# Patient Record
Sex: Male | Born: 1995 | Race: White | Hispanic: No | Marital: Single | Smoking: Never smoker
Health system: Southern US, Community
[De-identification: ages and names within clinical notes are randomized; demographics above are authoritative.]

## PROBLEM LIST (undated history)

## (undated) HISTORY — PX: APPENDECTOMY: SHX54

---
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
## 2014-01-18 ENCOUNTER — Encounter: Payer: Self-pay | Admitting: Emergency Medicine

## 2014-01-18 ENCOUNTER — Ambulatory Visit (INDEPENDENT_AMBULATORY_CARE_PROVIDER_SITE_OTHER): Payer: BC Managed Care – PPO | Admitting: Emergency Medicine

## 2014-01-18 VITALS — BP 110/72 | HR 69 | Temp 99.0°F | Resp 16 | Ht 72.0 in | Wt 172.4 lb

## 2014-01-18 DIAGNOSIS — Z0000 Encounter for general adult medical examination without abnormal findings: Secondary | ICD-10-CM

## 2014-01-18 DIAGNOSIS — Z23 Encounter for immunization: Secondary | ICD-10-CM

## 2014-01-18 LAB — CBC WITH DIFFERENTIAL/PLATELET
Basophils Absolute: 0 10*3/uL (ref 0.0–0.1)
Basophils Relative: 1 % (ref 0–1)
EOS PCT: 2 % (ref 0–5)
Eosinophils Absolute: 0.1 10*3/uL (ref 0.0–0.7)
HEMATOCRIT: 46.7 % (ref 39.0–52.0)
HEMOGLOBIN: 16.5 g/dL (ref 13.0–17.0)
LYMPHS ABS: 1.2 10*3/uL (ref 0.7–4.0)
LYMPHS PCT: 31 % (ref 12–46)
MCH: 31.7 pg (ref 26.0–34.0)
MCHC: 35.3 g/dL (ref 30.0–36.0)
MCV: 89.6 fL (ref 78.0–100.0)
MONO ABS: 0.5 10*3/uL (ref 0.1–1.0)
MONOS PCT: 13 % — AB (ref 3–12)
Neutro Abs: 2.1 10*3/uL (ref 1.7–7.7)
Neutrophils Relative %: 53 % (ref 43–77)
PLATELETS: 212 10*3/uL (ref 150–400)
RBC: 5.21 MIL/uL (ref 4.22–5.81)
RDW: 12 % (ref 11.5–15.5)
WBC: 3.9 10*3/uL — AB (ref 4.0–10.5)

## 2014-01-18 LAB — LIPID PANEL
CHOL/HDL RATIO: 4.5 ratio
Cholesterol: 174 mg/dL (ref 0–200)
HDL: 39 mg/dL — AB (ref >39)
LDL Cholesterol: 118 mg/dL — ABNORMAL HIGH (ref 0–99)
Triglycerides: 83 mg/dL (ref <150)
VLDL: 17 mg/dL (ref 0–40)

## 2014-01-18 LAB — GLUCOSE, POCT (MANUAL RESULT ENTRY): POC GLUCOSE: 85 mg/dL (ref 70–99)

## 2014-01-18 NOTE — Patient Instructions (Signed)

## 2014-01-18 NOTE — Progress Notes (Signed)
   Subjective:    Patient ID: Jose Woodard, male    DOB: 02/03/1996, 18 y.o.   MRN: [id number] This chart was scribed for Collene GobbleSteven A Veronica Fretz, MD by Julian HyMorgan Graham, ED Scribe. The patient was seen in Room 24. The patient's care was started at 11:45 AM.   HPI HPI Comments: Jose Woodard is a 18 y.o. male who presents to the [facility] for a complete physical exam. He is doing well, with no complaints.  Review of Systems  Constitutional: Negative for fever and chills.  HENT: Negative for drooling.   Gastrointestinal: Negative for nausea and vomiting.  Psychiatric/Behavioral: Negative for confusion.  All other systems reviewed and are negative.   No past medical history on file. Past Surgical History  Procedure Laterality Date  . Appendectomy      02/2013   No Known Allergies No current outpatient prescriptions on file.   No current facility-administered medications for this visit.     Objective:  Triage Vitals: BP 110/72  Pulse 69  Temp(Src) 99 F (37.2 C) (Oral)  Resp 16  Ht 6' (1.829 m)  Wt 172 lb 6.4 oz (78.2 kg)  BMI 23.38 kg/m2  SpO2 100%  Physical Exam CONSTITUTIONAL: Well developed/well nourished HEAD: Normocephalic/atraumatic EYES: EOMI/PERRL ENMT: Mucous membranes moist NECK: supple no meningeal signs SPINE:entire spine nontender CV: S1/S2 noted, no murmurs/rubs/gallops noted LUNGS: Lungs are clear to auscultation bilaterally, no apparent distress ABDOMEN: soft, nontender, no rebound or guarding GU:no cva tenderness NEURO: Pt is awake/alert, moves all extremitiesx4 EXTREMITIES: pulses normal, full ROM SKIN: warm, color normal PSYCH: no abnormalities of mood noted Results for orders placed in visit on 01/18/14  GLUCOSE, POCT (MANUAL RESULT ENTRY)      Result Value Ref Range   POC Glucose 85  70 - 99 mg/dl   Assessment & Plan:  40:9811:53 AM- Patient informed of current plan for treatment and evaluation and agrees with plan at this time.  He is  updated with a Tdap today. He will get started for Gardisil vaccination when he is home over the winter break. CBC labs with cholesterol and sugar done.

## 2015-10-24 ENCOUNTER — Ambulatory Visit
Admission: RE | Admit: 2015-10-24 | Discharge: 2015-10-24 | Disposition: A | Discharge status: Home / Self Care | Payer: BC Managed Care – PPO | Source: Ambulatory Visit | Attending: Emergency Medicine | Admitting: Emergency Medicine

## 2015-10-24 ENCOUNTER — Ambulatory Visit (INDEPENDENT_AMBULATORY_CARE_PROVIDER_SITE_OTHER): Payer: BC Managed Care – PPO | Admitting: Emergency Medicine

## 2015-10-24 VITALS — BP 110/74 | HR 88 | Temp 98.2°F | Resp 16 | Ht 73.25 in | Wt 161.4 lb

## 2015-10-24 DIAGNOSIS — N1330 Unspecified hydronephrosis: Secondary | ICD-10-CM

## 2015-10-24 DIAGNOSIS — N50819 Testicular pain, unspecified: Secondary | ICD-10-CM

## 2015-10-24 DIAGNOSIS — N1339 Other hydronephrosis: Secondary | ICD-10-CM

## 2015-10-24 LAB — POCT URINALYSIS DIP (MANUAL ENTRY)
BILIRUBIN UA: NEGATIVE
GLUCOSE UA: NEGATIVE
Ketones, POC UA: NEGATIVE
Leukocytes, UA: NEGATIVE
Nitrite, UA: NEGATIVE
RBC UA: NEGATIVE
SPEC GRAV UA: 1.015
Urobilinogen, UA: 0.2
pH, UA: 8.5

## 2015-10-24 LAB — POC MICROSCOPIC URINALYSIS (UMFC): MUCUS RE: ABSENT

## 2015-10-24 NOTE — Progress Notes (Addendum)
Subjective:  This chart was scribed for Lesle ChrisSteven Kindle Strohmeier MD, by Veverly FellsHatice Demirci,scribe, at [facility].  This patient was seen in room 4 and the patient's care was started at 12:54 PM.   Chief Complaint  Patient presents with  . Testicle Pain     Patient ID: Jose Woodard, male    DOB: [birth year], 20 y.o.   MRN: [id number]  HPI HPI Comments: Jose Woodard is a 20 y.o. male who presents to the [facility] complaining of "rotating" discomfort in his testicles which radiates to his lower back and abdomen intermittently.  Denies any pain with urination or frequency. On his left testicle, he has a "very small hard" nodule which he located himself but did not feel anything else which felt unusual.    Patient is a Holiday representativesenior in college at [employer] and would eventually like a Scientist, water [employer] in Nurse, mental healthpublic health.    There are no active problems to display for this patient.  History reviewed. No pertinent past medical history. Past Surgical History  Procedure Laterality Date  . Appendectomy      02/2013   No Known Allergies Prior to Admission medications   Not on File   Social History   Social History  . Marital Status: Single    Spouse Name: N/A  . Number of Children: N/A  . Years of Education: N/A   Occupational History  . Not on file.   Social History Main Topics  . Smoking status: Never Smoker   . Smokeless tobacco: Not on file  . Alcohol Use: Yes     Comment: occas  . Drug Use: No  . Sexual Activity: Not on file   Other Topics Concern  . Not on file   Social History Narrative       Review of Systems  Constitutional: Negative for fever and chills.  Eyes: Negative for pain, redness and itching.  Respiratory: Negative for cough, choking and shortness of breath.   Gastrointestinal: Positive for abdominal pain. Negative for nausea and vomiting.  Genitourinary: Positive for testicular pain.  Musculoskeletal: Positive for back pain. Negative for neck  pain and neck stiffness.  Skin: Negative for color change.       Objective:   Physical Exam  Filed Vitals:   10/24/15 1242  BP: 110/74  Pulse: 88  Temp: 98.2 F (36.8 C)  TempSrc: Oral  Resp: 16  Height: 6' 1.25" (1.861 m)  Weight: 161 lb 6.4 oz (73.211 kg)  SpO2: 99%    CONSTITUTIONAL: Well developed/well nourished HEAD: Normocephalic/atraumatic EYES: EOMI/PERRL ENMT: Mucous membranes moist NECK: supple no meningeal signs SPINE/BACK:entire spine nontender CV: S1/S2 noted, no murmurs/rubs/gallops noted LUNGS: Lungs are clear to auscultation bilaterally, no apparent distress ABDOMEN: soft, nontender, no rebound or guarding, bowel sounds noted throughout abdomen GU:no cva tenderness NEURO: Pt is awake/alert/appropriate, moves all extremitiesx4.  No facial droop.   EXTREMITIES: pulses normal/equal, full ROM SKIN: warm, color normal PSYCH: no abnormalities of mood noted, alert and oriented to situation Testicles: 2-3 mm cyst in the right epididymus with slight tenderness of the chord 2-3 mm cystic area in the chord on the left.   Results for orders placed or performed in visit on 10/24/15  POCT Microscopic Urinalysis ([facility])  Result Value Ref Range   WBC,UR,HPF,POC None None WBC/hpf   RBC,UR,HPF,POC None None RBC/hpf   Bacteria None None, Too numerous to count   Mucus Absent Absent   Epithelial Cells, UR Per Microscopy Few (A) None,  Too numerous to count cells/hpf   Amorphous Present   POCT urinalysis dipstick  Result Value Ref [place]   Color, UA yellow yellow   Clarity, UA cloudy (A) clear   Glucose, UA negative negative   Bilirubin, UA negative negative   Ketones, POC UA negative negative   Spec Grav, UA 1.015    Blood, UA negative negative   pH, UA 8.5    Protein Ur, POC trace (A) negative   Urobilinogen, UA 0.2    Nitrite, UA Negative Negative   Leukocytes, UA Negative Negative      Assessment & Plan:  I will call you with results of your cultures. We  will schedule you for an ultrasound. I then spoke with his mother who stated he had a history of dilatation of the left renal pelvis. In review of the chart he has not had an ultrasound since 2007. A repeat renal ultrasound was also ordered to be done at a later time.I personally performed the services described in this documentation, which was scribed in my presence. The recorded information has been reviewed and is accurate.      Collene Gobble, MD

## 2015-10-24 NOTE — Patient Instructions (Addendum)
Your appointment is today at [facility] [address] at 240pm.      IF you received an x-ray today, you will receive an invoice from [facility]. Please contact [facility] at [phone] with questions or concerns regarding your invoice.   IF you received labwork today, you will receive an invoice from [facility]. Please contact [facility] at [phone] with questions or concerns regarding your invoice.   Our billing staff will not be able to assist you with questions regarding bills from these companies.  You will be contacted with the lab results as soon as they are available. The fastest way to get your results is to activate your My Chart account. Instructions are located on the last page of this paperwork. If you have not heard from us regarding the results in 2 weeks, please contact this office.

## 2015-10-25 ENCOUNTER — Encounter: Payer: Self-pay | Admitting: Radiology

## 2015-10-25 LAB — GC/CHLAMYDIA PROBE AMP
CT PROBE, AMP APTIMA: NOT DETECTED
GC PROBE AMP APTIMA: NOT DETECTED

## 2015-10-26 LAB — URINE CULTURE
COLONY COUNT: NO GROWTH
ORGANISM ID, BACTERIA: NO GROWTH

## 2017-08-24 IMAGING — US US ART/VEN ABD/PELV/SCROTUM DOPPLER LTD
1 series · 14 of 25 positions shown · non-contrast
Comparison: None.

ADDENDUM:
Right epididymal cyst size is 3 mm not 3 cm.
CLINICAL DATA: Scrotal pain

EXAM:
SCROTAL ULTRASOUND
DOPPLER ULTRASOUND OF THE TESTICLES
TECHNIQUE: Complete ultrasound examination of the testicles, epididymis, and
other scrotal structures was performed. Color and spectral Doppler
ultrasound were also utilized to evaluate blood flow to the
testicles.

[Series 1: us art/ven abd/pelv/scrotum doppler ltd · 14 of 38 slices shown]
[im 1/38]
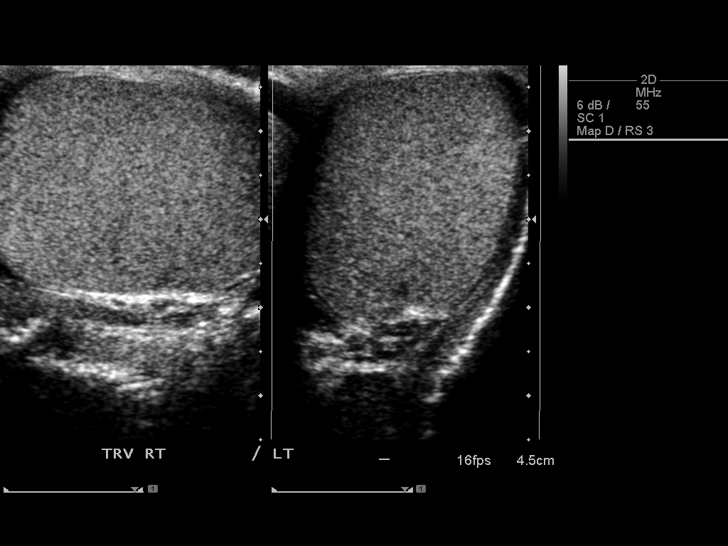
[im 4/38]
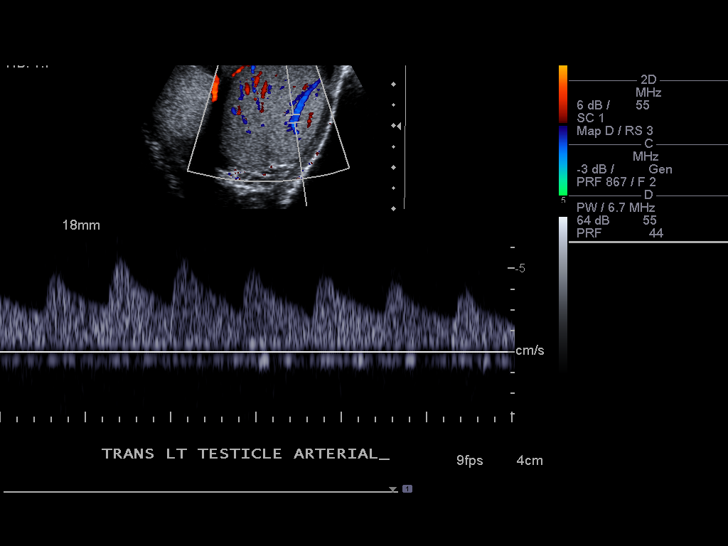
[im 7/38]
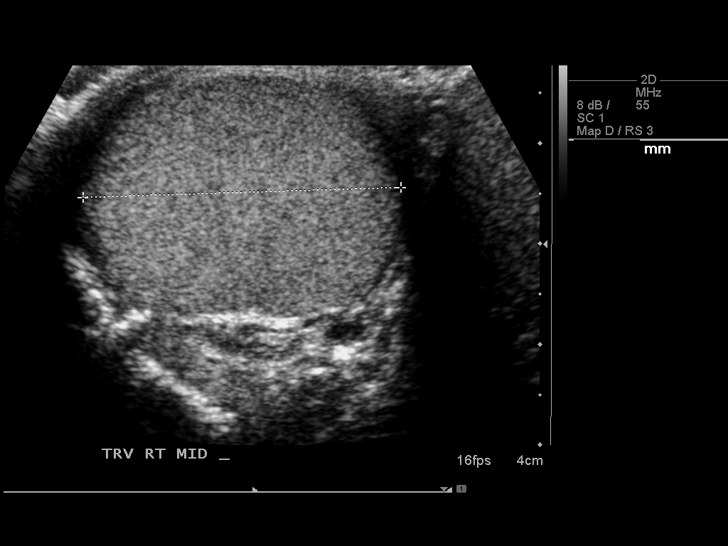
[im 10/38]
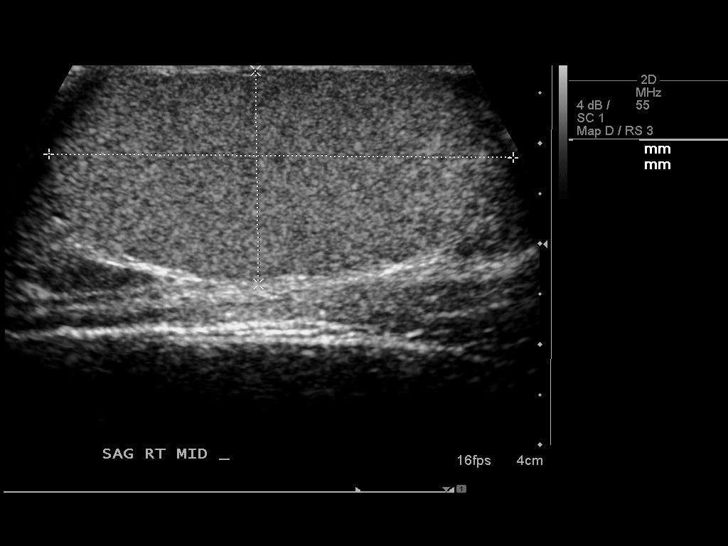
[im 13/38]
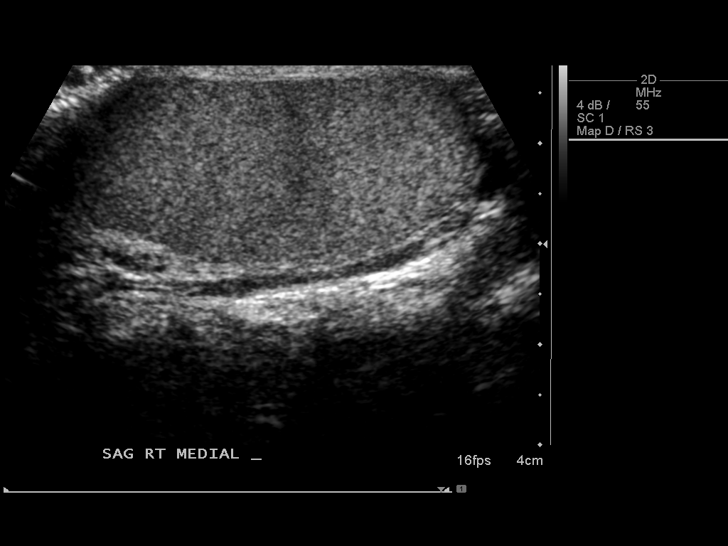
[im 14/38]
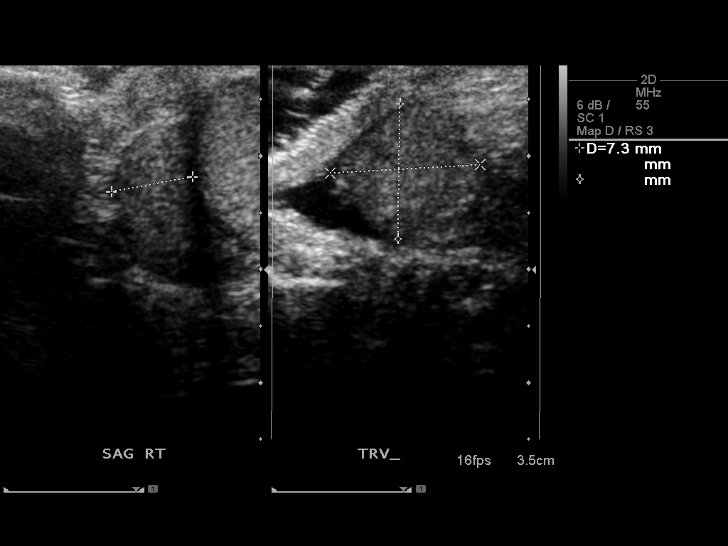
[im 17/38]
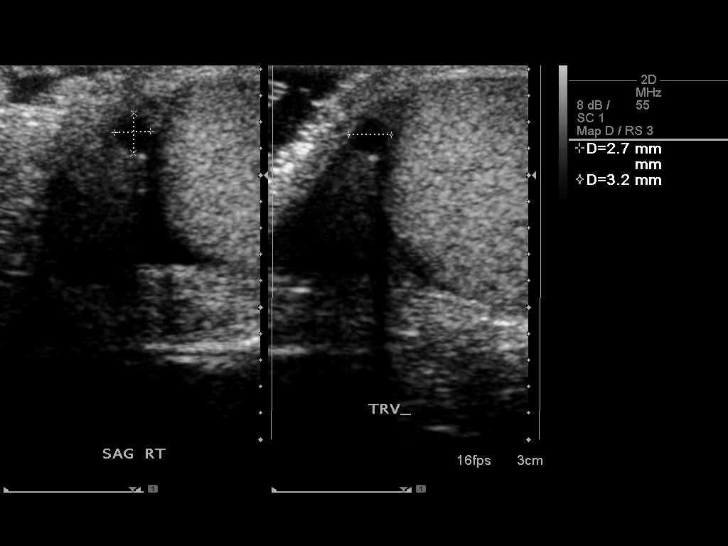
[im 21/38]
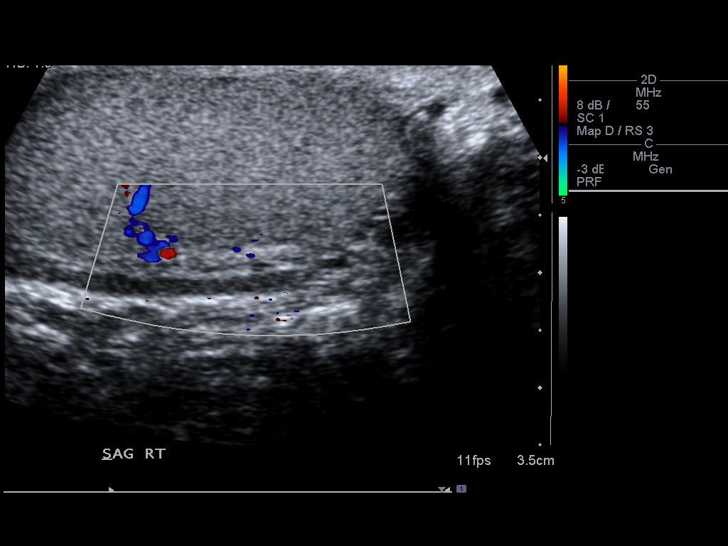
[im 24/38]
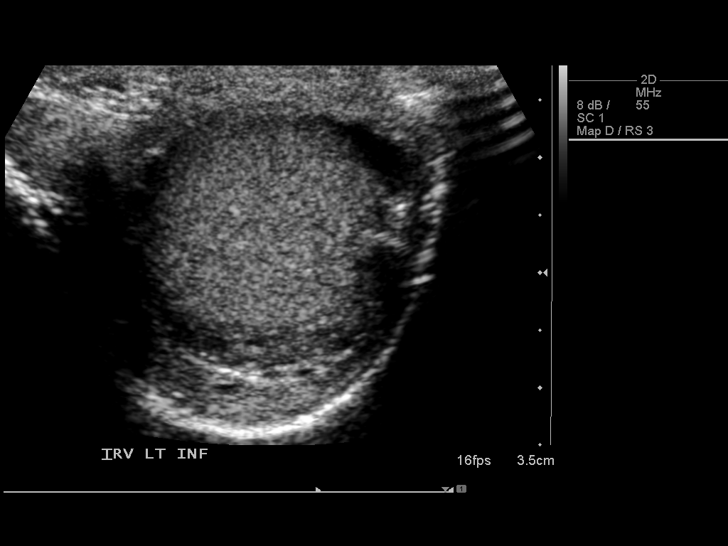
[im 25/38]
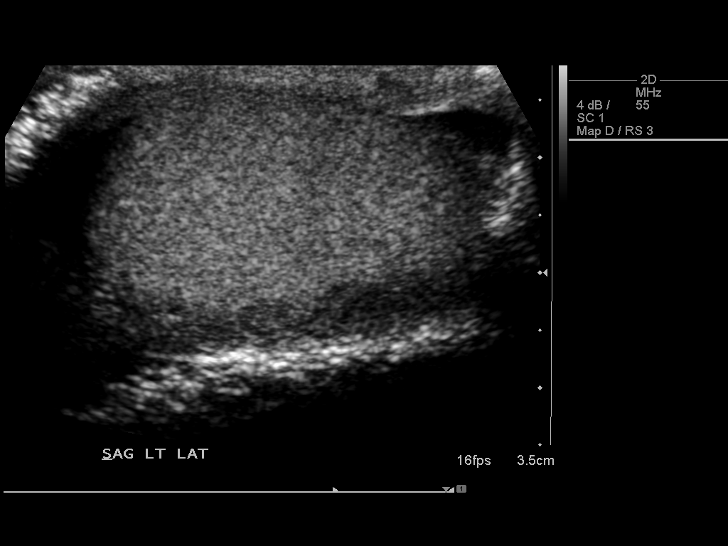
[im 28/38]
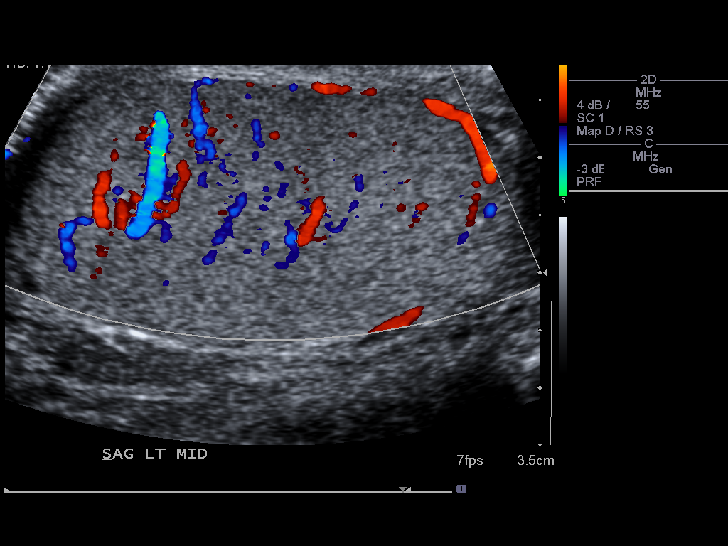
[im 31/38]
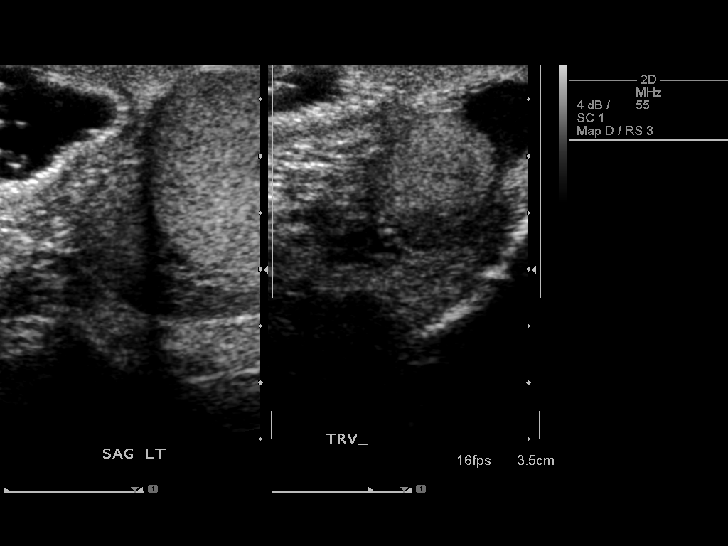
[im 34/38]
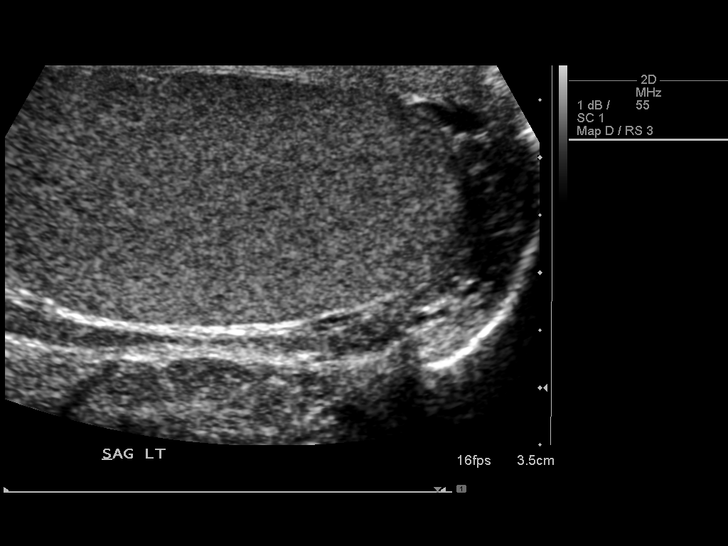
[im 38/38]
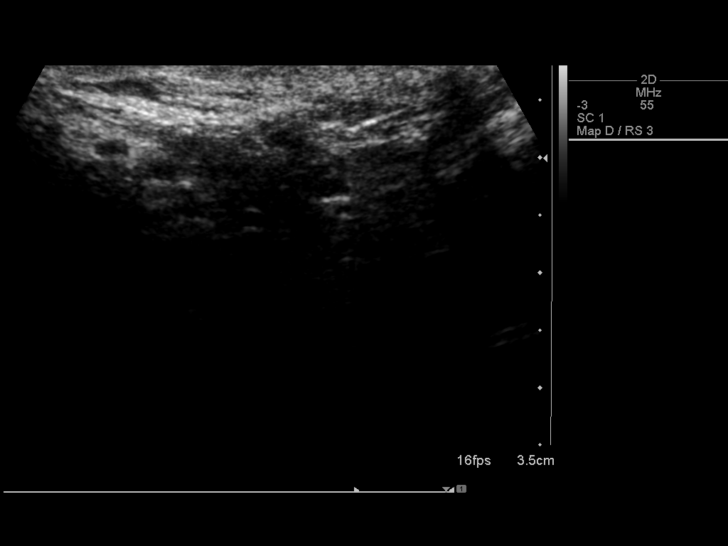

[14 of 25 positions shown; findings below may reference images not displayed]

FINDINGS: Right testicle

Measurements: 4.6 x 2.1 x 3.2 cm.. No mass or microlithiasis
visualized.

Left testicle

Measurements: 4.4 x 2.9 x 2.6 cm.. No mass or microlithiasis
visualized. By history there is a palpable abnormality in the left
scrotum although evaluation of this area shows no focal mass lesion.

Right epididymis:  A 3 cm epididymal cyst is noted.

Left epididymis:  Normal in size and appearance.

Hydrocele:  None visualized.

Varicocele:  None visualized.

Pulsed Doppler interrogation of both testes demonstrates normal low
resistance arterial and venous waveforms bilaterally.
IMPRESSION: Normal appearing testicles bilaterally.

## 2017-10-27 ENCOUNTER — Encounter (INDEPENDENT_AMBULATORY_CARE_PROVIDER_SITE_OTHER): Payer: Self-pay

## 2017-10-27 ENCOUNTER — Ambulatory Visit: Payer: BC Managed Care – PPO | Admitting: Cardiovascular Disease

## 2017-10-27 ENCOUNTER — Telehealth: Payer: Self-pay | Admitting: Cardiovascular Disease

## 2017-10-27 ENCOUNTER — Encounter: Payer: Self-pay | Admitting: Cardiovascular Disease

## 2017-10-27 VITALS — BP 108/62 | HR 56 | Ht 73.25 in | Wt 166.0 lb

## 2017-10-27 DIAGNOSIS — R079 Chest pain, unspecified: Secondary | ICD-10-CM | POA: Diagnosis not present

## 2017-10-27 DIAGNOSIS — I319 Disease of pericardium, unspecified: Secondary | ICD-10-CM

## 2017-10-27 NOTE — Progress Notes (Signed)
Cardiology Office Note   Date:  10/27/2017   ID:  Stace Peace, DOB [birth year], MRN [id number]  PCP:  Patient, No Pcp Per  Cardiologist:   Jenkins Rouge, MD   No chief complaint on file.     History of Present Illness: Jose Woodard is a 22 y.o. male who presents for consultation regarding chest pain Referred by Dr Charlotte Sanes (mother) who recently retired as Insurance underwriter ER doctor. 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 Active biking running and lifting some weights. Had some exertional chest pain last fall seen in ER ECG and CXR ok told to see cardiologist but did not. 6 weeks ago had some vaccines/shots for possible trip to [place] No fever, joint pain rash or inflammatory symptoms Last few weeks more tightness in chest with exercise as well as abdominal tightness. He indicates not like runners cramp No associated dyspnea, diaphoresis, palpitations syncope Pain can radiate towards back. Not positional or pleuritic  Mom also concerned about family history of CAD and aortic dissection     History reviewed. No pertinent past medical history.  Past Surgical History:  Procedure Laterality Date  . APPENDECTOMY     02/2013     No current outpatient medications on file.   No current facility-administered medications for this visit.     Allergies:   Patient has no known allergies.    Social History:  The patient  reports that he has never smoked. He has never used smokeless tobacco. He reports that he drinks alcohol. He reports that he does not use drugs.   Family History:  The patient's family history includes Aortic aneurysm in his maternal aunt; Aortic dissection in his maternal uncle; Cancer in his mother; Heart attack in his maternal grandmother.    ROS:  Please see the history of present illness.   Otherwise, review of systems are positive for none.   All other systems are reviewed and negative.    PHYSICAL EXAM: VS:  BP 108/62   Pulse (!) 56   Ht 6' 1.25" (1.861 m)   Wt 166 lb  (75.3 kg)   SpO2 98%   BMI 21.75 kg/m  , BMI Body mass index is 21.75 kg/m. Affect appropriate Healthy:  appears stated age 33: normal Neck supple with no adenopathy JVP normal no bruits no thyromegaly Lungs clear with no wheezing and good diaphragmatic motion Heart:  S1/S2 no murmur, no rub, gallop or click PMI normal Abdomen: benighn, BS positve, no tenderness, no AAA no bruit.  No HSM or HJR Distal pulses intact with no bruits No edema Neuro non-focal Skin warm and dry No muscular weakness    EKG:  SR read as acute inferior MI early repol in inferior lateral leads no old one to compare    Recent Labs: No results found for requested labs within last 8760 hours.    Lipid Panel    Component Value Date/Time   CHOL 174 01/18/2014 1204   TRIG 83 01/18/2014 1204   HDL 39 (L) 01/18/2014 1204   CHOLHDL 4.5 01/18/2014 1204   VLDL 17 01/18/2014 1204   LDLCALC 118 (H) 01/18/2014 1204      Wt Readings from Last 3 Encounters:  10/27/17 166 lb (75.3 kg)  10/24/15 161 lb 6.4 oz (73.2 kg)  01/18/14 172 lb 6.4 oz (78.2 kg)      Other studies Reviewed: Additional studies/ records that were reviewed today include: None .    ASSESSMENT AND PLAN:  1.  Chest Pain: certainly  exertional. Doubt relations to vaccines as anteceded. ECG I think is early repolarization and not pericardial changes Discussed options of stress testing echo and cardiac CT Favor latter as there is a concern to visualize the entire aorta, r/o anomalous coronary arteries and assess the pericardium. Good candidate with low HR and weight will check BMET, ESR , ANA and CBC today    Current medicines are reviewed at length with the patient today.  The patient does not have concerns regarding medicines.  The following changes have been made:  no change    Disposition:   FU with cards PRN      Signed, Jenkins Rouge, MD  10/27/2017 9:20 AM    [facility] [place],  [place], [place]  [address] Phone: [phone]; Fax: [phone]

## 2017-10-27 NOTE — Telephone Encounter (Signed)
Left message for patient to call back.  Please arrive at the [facility] main entrance of [facility] [address] [place], [place] [address] [phone]  Proceed to the [facility] Radiology Department (First Floor).  Please follow these instructions carefully (unless otherwise directed):   On the Night Before the Test: . Drink plenty of water. . Do not consume any caffeinated/decaffeinated beverages or chocolate 12 hours prior to your test. . Do not take any antihistamines 12 hours prior to your test.  On the Day of the Test: . Drink plenty of water. Do not drink any water within one hour of the test. . Do not eat any food 4 hours prior to the test. . You may take your regular medications prior to the test.  After the Test: . Drink plenty of water. . After receiving IV contrast, you may experience a mild flushed feeling. This is normal. . On occasion, you may experience a mild rash up to 24 hours after the test. This is not dangerous. If this occurs, you can take Benadryl 25 mg and increase your fluid intake. . If you experience trouble breathing, this can be serious. If it is severe call 911 IMMEDIATELY. If it is mild, please call our office. . If you take any of these medications: Glipizide/Metformin, Avandament, Glucavance, please do not take 48 hours after completing test.

## 2017-10-27 NOTE — Patient Instructions (Addendum)
Medication Instructions:  Your physician recommends that you continue on your current medications as directed. Please refer to the Current Medication list given to you today.  Labwork: Your physician recommends that you have lab work today- CBC, BMET, SED RATE, ANA  Testing/Procedures: Your physician has requested that you have cardiac CT as soon as possible. Cardiac computed tomography (CT) is a painless test that uses an x-ray machine to take clear, detailed pictures of your heart. For further information please visit [email or web address] Please follow instruction sheet as given.  Follow-Up: Your physician wants you to follow-up as needed with Dr. Eden Emms.    If you need a refill on your cardiac medications before your next appointment, please call your pharmacy.

## 2017-10-27 NOTE — Telephone Encounter (Signed)
New Message    Patient needs instructions for the Cardiac Ct - he is scheduled for 10/30/17 130pm

## 2017-10-28 LAB — CBC WITH DIFFERENTIAL/PLATELET
Basophils Absolute: 0 10*3/uL (ref 0.0–0.2)
Basos: 1 %
EOS (ABSOLUTE): 0.1 10*3/uL (ref 0.0–0.4)
EOS: 2 %
HEMATOCRIT: 48.1 % (ref 37.5–51.0)
Hemoglobin: 16.9 g/dL (ref 13.0–17.7)
IMMATURE GRANULOCYTES: 0 %
Immature Grans (Abs): 0 10*3/uL (ref 0.0–0.1)
LYMPHS ABS: 1.4 10*3/uL (ref 0.7–3.1)
Lymphs: 31 %
MCH: 31.9 pg (ref 26.6–33.0)
MCHC: 35.1 g/dL (ref 31.5–35.7)
MCV: 91 fL (ref 79–97)
MONOS ABS: 0.4 10*3/uL (ref 0.1–0.9)
Monocytes: 8 %
NEUTROS PCT: 58 %
Neutrophils Absolute: 2.7 10*3/uL (ref 1.4–7.0)
PLATELETS: 223 10*3/uL (ref 150–450)
RBC: 5.29 x10E6/uL (ref 4.14–5.80)
RDW: 12.4 % (ref 12.3–15.4)
WBC: 4.6 10*3/uL (ref 3.4–10.8)

## 2017-10-28 LAB — ANA: Anti Nuclear Antibody(ANA): NEGATIVE

## 2017-10-28 LAB — BASIC METABOLIC PANEL
BUN / CREAT RATIO: 13 (ref 9–20)
BUN: 12 mg/dL (ref 6–20)
CALCIUM: 10.3 mg/dL — AB (ref 8.7–10.2)
CO2: 23 mmol/L (ref 20–29)
Chloride: 98 mmol/L (ref 96–106)
Creatinine, Ser: 0.9 mg/dL (ref 0.76–1.27)
GFR calc Af Amer: 138 mL/min/{1.73_m2} (ref >59)
GFR, EST NON AFRICAN AMERICAN: 119 mL/min/{1.73_m2} (ref >59)
Glucose: 83 mg/dL (ref 65–99)
Potassium: 4.2 mmol/L (ref 3.5–5.2)
Sodium: 138 mmol/L (ref 134–144)

## 2017-10-28 LAB — SEDIMENTATION RATE: Sed Rate: 1 mm/hr (ref 0–15)

## 2017-10-28 NOTE — Telephone Encounter (Signed)
Tried to call patient, no answer and voicemail is full. Unable to leave message. Will try again later.

## 2017-10-28 NOTE — Telephone Encounter (Signed)
Patient called back. Informed patient of instructions for CT. Patient verbalized understanding.

## 2017-10-30 ENCOUNTER — Ambulatory Visit (HOSPITAL_COMMUNITY)
Admission: RE | Admit: 2017-10-30 | Discharge: 2017-10-30 | Disposition: A | Discharge status: Home / Self Care | Payer: BC Managed Care – PPO | Source: Ambulatory Visit | Attending: Cardiovascular Disease | Admitting: Cardiovascular Disease

## 2017-10-30 ENCOUNTER — Ambulatory Visit (HOSPITAL_COMMUNITY): Payer: BC Managed Care – PPO

## 2017-10-30 DIAGNOSIS — I319 Disease of pericardium, unspecified: Secondary | ICD-10-CM | POA: Diagnosis not present

## 2017-10-30 DIAGNOSIS — R079 Chest pain, unspecified: Secondary | ICD-10-CM | POA: Diagnosis not present

## 2017-10-30 MED ORDER — NITROGLYCERIN 0.4 MG SL SUBL
SUBLINGUAL_TABLET | SUBLINGUAL | Status: AC
  Filled 2017-10-30: qty 2

## 2017-10-30 MED ORDER — NITROGLYCERIN 0.4 MG SL SUBL
0.8000 mg | SUBLINGUAL_TABLET | Freq: Once | SUBLINGUAL | Status: AC
  Administered 2017-10-30: 0.8 mg via SUBLINGUAL

## 2017-10-30 MED ORDER — IOPAMIDOL (ISOVUE-370) INJECTION 76%
100.0000 mL | Freq: Once | INTRAVENOUS | Status: AC | PRN
  Administered 2017-10-30: 100 mL via INTRAVENOUS

## 2017-10-30 MED ORDER — METOPROLOL TARTRATE 5 MG/5ML IV SOLN
5.0000 mg | INTRAVENOUS | Status: AC | PRN
  Administered 2017-10-30: 5 mg via INTRAVENOUS

## 2017-10-30 MED ORDER — IOPAMIDOL (ISOVUE-370) INJECTION 76%
INTRAVENOUS | Status: AC
  Filled 2017-10-30: qty 100

## 2017-10-30 MED ORDER — METOPROLOL TARTRATE 5 MG/5ML IV SOLN
INTRAVENOUS | Status: AC
  Administered 2017-10-30: 5 mg via INTRAVENOUS
  Filled 2017-10-30: qty 5

## 2017-11-04 ENCOUNTER — Encounter: Payer: Self-pay | Admitting: Cardiovascular Disease

## 2017-11-24 ENCOUNTER — Encounter

## 2017-12-25 ENCOUNTER — Ambulatory Visit: Payer: BC Managed Care – PPO | Admitting: Cardiology

## 2019-08-31 IMAGING — CT CT HEART MORP W/ CTA COR W/ SCORE W/ CA W/CM &/OR W/O CM
4 of 6 series · 10 of 20 positions shown, 11 images · IV contrast (APPLIED)
Comparison: None.

CLINICAL DATA: Chest pain

EXAM:
Cardiac CTA
MEDICATIONS:
Sub lingual nitro.  4 mg and lopressor 5mg
TECHNIQUE: The patient was scanned on a Siemens [REDACTED]ice Force scanner. Gantry
rotation speed was 250 msecs. Collimation was .6 mm . A 100 kV
prospective scan was triggered in the ascending thoracic aorta at
140 HU's full mA between 35-75% of the R-R interval. Average HR
during the scan was 62 bpm. The 3D data set was interpreted on a
dedicated work station using MPR, MIP and VRT modes. A total of 80cc
of contrast was used.

[Series 6: best diast 72 % · axial · 0.39mm/px · z∈[+1071,+1139]mm · 2 of 511 slices shown, 3 images]
[im 171/511  vessel]
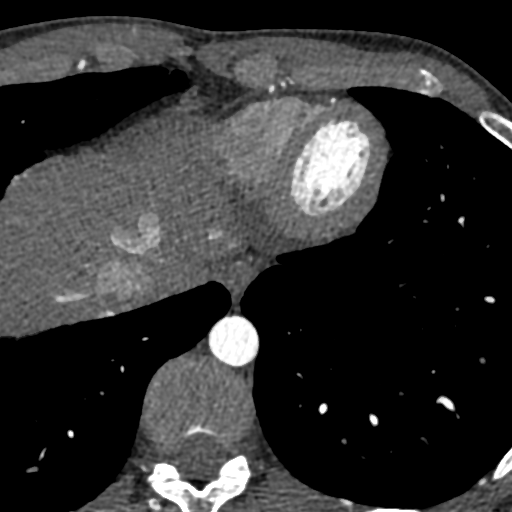
[im 171/511  lung]
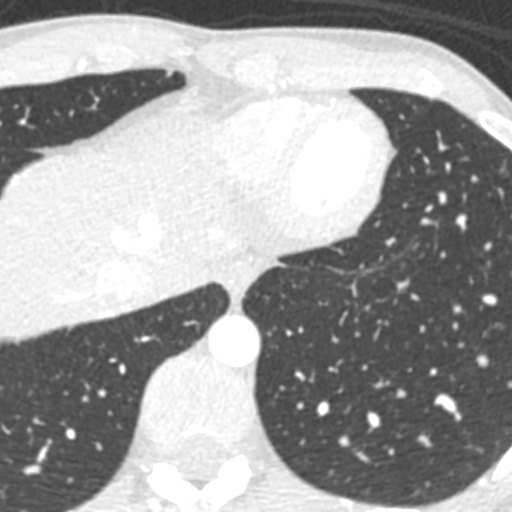
[im 341/511  vessel]
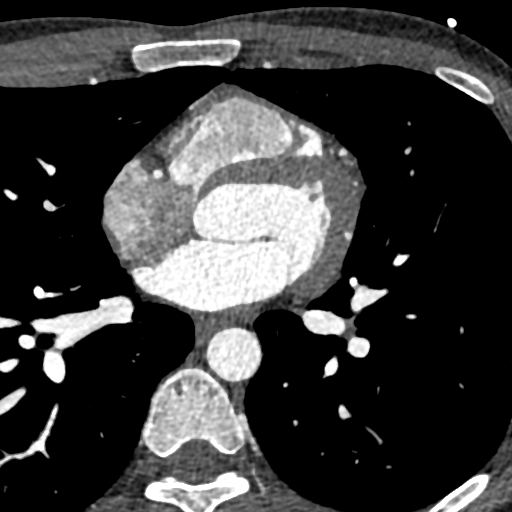

[Series 7: best syst 38 % · axial · 0.39mm/px · z∈[+1071,+1139]mm · 2 of 511 slices shown]
[im 171/511  vessel]
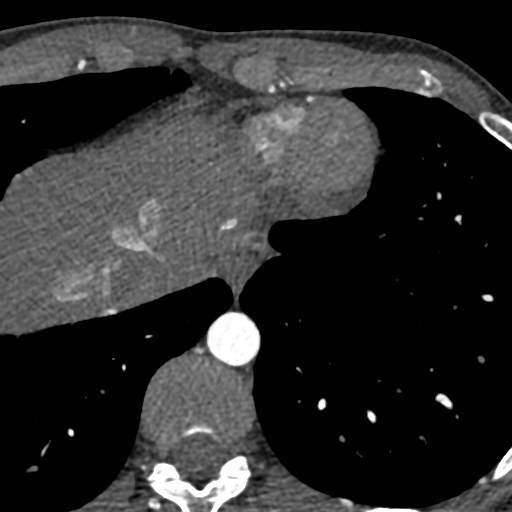
[im 341/511  vessel]
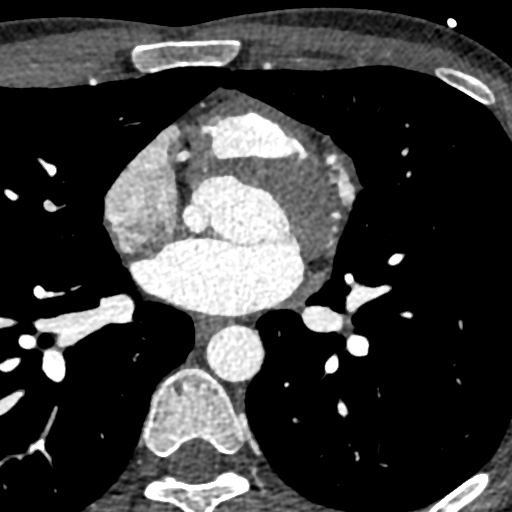

[Series 8: ts diast sharp 72 % · axial · 0.39mm/px · z∈[+1054,+1156]mm · 3 of 511 slices shown]
[im 128/511  lung]
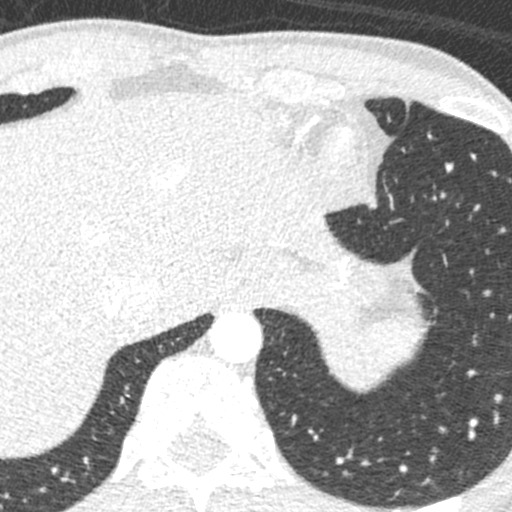
[im 256/511  lung]
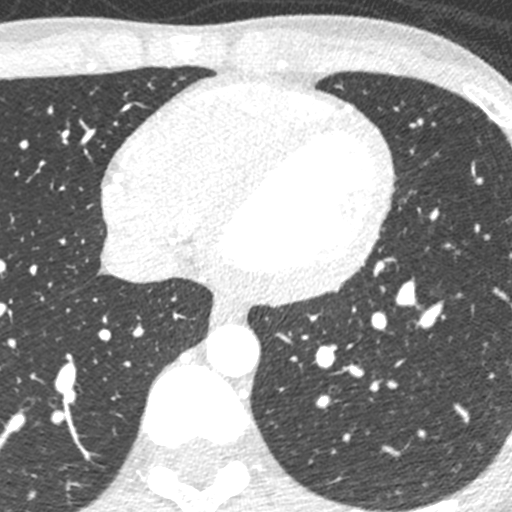
[im 383/511  lung]
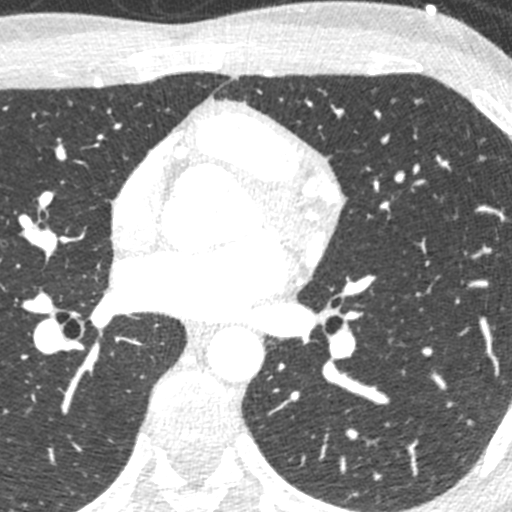

[Series 9: ts syst sharp 38 % · axial · 0.39mm/px · z∈[+1054,+1156]mm · 3 of 511 slices shown]
[im 128/511  lung]
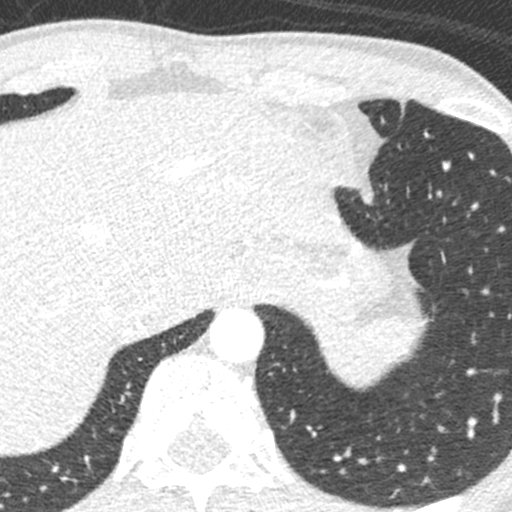
[im 256/511  lung]
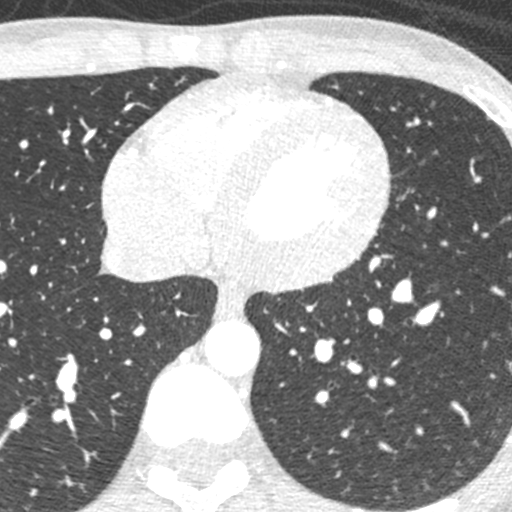
[im 383/511  lung]
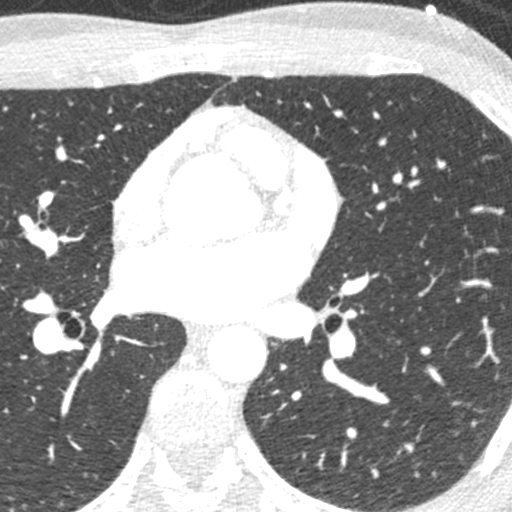

[10 of 20 positions shown; findings below may reference images not displayed]

FINDINGS: Non-cardiac: See separate report from [REDACTED]. No
significant findings on limited lung and soft tissue windows.

Calcium Score:

Coronary Arteries: Right dominant with no anomalies

LM: Normal

LAD: Normal

D1: Normal

D2: Normal

D3: Normal

D4: Normal

Circumflex: Small primarily AV groove branch normal

RCA: Dominant and normal

PDA: Normal

PLA: Normal
IMPRESSION: 1. Calcium Score 0

2.  Normal right dominant coronary arteries with no anomalies

3.  Normal aortic root no aneurysm 2.9 cm diameter

Mohammedidris Chodhari

EXAM:
OVER-READ INTERPRETATION  CT CHEST

The following report is an over-read performed by radiologist Dr.
Maricar Ito [REDACTED] on 10/30/2017. This
over-read does not include interpretation of cardiac or coronary
anatomy or pathology. The coronary calcium score/coronary CTA
interpretation by the cardiologist is attached.
FINDINGS: Within the visualized portions of the thorax there are no suspicious
appearing pulmonary nodules or masses, there is no acute
consolidative airspace disease, no pleural effusions, no
pneumothorax and no lymphadenopathy. Visualized portions of the
upper abdomen are unremarkable. There are no aggressive appearing
lytic or blastic lesions noted in the visualized portions of the
skeleton.
IMPRESSION: No significant incidental noncardiac findings are noted.
